# Patient Record
Sex: Male | Born: 1990 | Race: White | Hispanic: No | Marital: Married | Smoking: Never smoker
Health system: Southern US, Community
[De-identification: ages and names within clinical notes are randomized; demographics above are authoritative.]

## PROBLEM LIST (undated history)

## (undated) DIAGNOSIS — K219 Gastro-esophageal reflux disease without esophagitis: Secondary | ICD-10-CM

## (undated) DIAGNOSIS — F909 Attention-deficit hyperactivity disorder, unspecified type: Secondary | ICD-10-CM

## (undated) DIAGNOSIS — I10 Essential (primary) hypertension: Secondary | ICD-10-CM

## (undated) DIAGNOSIS — J302 Other seasonal allergic rhinitis: Secondary | ICD-10-CM

## (undated) HISTORY — PX: ADENOIDECTOMY: SUR15

## (undated) HISTORY — PX: TYMPANOPLASTY: SHX33

---
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
## 2010-09-04 NOTE — Op Note (Signed)
NAMETOBEN, ACUNA              ACCOUNT NO.:  [id number]   MEDICAL RECORD NO.:  [id number]          PATIENT TYPE:  AMB   LOCATION:  DSC                          FACILITY:  [facility]   PHYSICIAN:  Carolan Shiver, M.D.    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  Dustin Santiago has had a lifelong history of chronic  ear disease, he was first seen by me at age 70 months on February 07, 1991.  On February 20, 1991 he underwent bilateral myringotomy  Paparella type I  tubes.  He did well while the tubes were in place.  He required revision BMT  and a primary adenoidectomy on June 02, 1995.  By December 07, 1996, he  had traumatic perforation AD and a healing perforation AS.  He was continued  to be followed.  He developed some retropharyngeal lymphadenopathy in 2002,  which was worked up and was found to be negative for any abscess.  Eventually healed the right tympanic membrane but the left TM did not heal,  he was left with a dry central anterior 10% perforation and a very thin  atrophic tympanic membrane.  He was recommended for fat graft myringoplasty  AS.  He wanted to proceed after swimming season of 2007.  Preop audiometric  testing in July 15, 2005 documented normal hearing in both ears.   JUSTIFICATION:  A justification for this outpatient setting is age,  justification for overnight stay is not applicable.   ANESTHESIA:  General LMA anesthesia.   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   OPERATION:  Fat graft myringoplasty left tympanic membrane.   SURGEON:  Carolan Shiver, M.D.   ANESTHESIA:  General LMA   COMPLICATIONS:  None.   DISCHARGE STATUS:  Stable.   SUMMARY REPORT:  After the patient was taken to the operating room, he was  placed in supine position.  He had been preoperatively sedated.  General  mask induction was then performed, an IV was begun and he was orally  intubated with a LMA.  His hair was taped.  A stockinette cap was applied  around his left ear, his left external ear had been marked in the holding  area.  Time out was performed.  His left ear and posterior area were  cleansed with 70% isopropyl alcohol and his left medial earlobe was  infiltrated with 1 mL of 1% Xylocaine with 1:100,000 epinephrine.  His left  ear and hemiface were then prepped with Betadine and draped in the standard  fashion for myringoplasty.   Examination of the left ear canal revealed a 6 mm diameter canal.  The canal  was cleaned, there was a 10% dry central anterior perforation AS with a very  thin atrophic  tympanic membrane.  Middle ear mucosa was healthy as viewed  through the perforation.  The epithelial margin of the perforation was then  excised with a 90 degree Kraus TM MicroCut forceps.  The MicroCut was used  to remove the epithelial margin for 360 degrees.  There was a bleeding edge.  A 45 degree McCabe  perforation rasp was used to rasp for 360 degrees around  the mucosal surface of the perforation.  A 1 cm incision was then made to  the medial surface of the left earlobe, a fat graft was harvested and placed  in saline.  The incision was closed with running locking 5-0 Novofil and  bacitracin ointment was applied.   The fat graft was then transferred to the perforation and placed through the  perforation in a dumbbell fashion using a straight Dorma Russell pick.  An oversized  graft was utilized.  Three pledgets of Gelfoam soaked in Cipro HC were then  packed lateral to the graft to stabilize it.  The patient was then awakened,  extubated and transferred to his hospital bed.  He appeared  to tolerate the  general LMA anesthesia and the procedure well and left the operating room in  stable condition after a bacitracin cotton ball was placed in his ear canal.   Dustin Santiago will be discharged today as an outpatient with his mother.  She  will be instructed to return him to my office for followup on December 13, 2005, at 4:20 p.m.   DISCHARGE MEDICATIONS:  Discharge medications include the following:  1. Ceftin 500 mg p.o. twice daily  x10 days with food, #20.  2. Darvocet N100, #15, one to two p.o. every 4 hours p.r.n. pain.  3. Phenergan suppositories 25 mg, #2, one every 6 hours p.r.n. nausea.  4. Cipro HC 10 mL 3 drops AS three times daily x7 days.   DISCHARGE INSTRUCTIONS:  His mother is to have him follow a soft diet today,  regular diet tomorrow.  Keep his head elevated tonight.  Avoid aspirin or  aspirin products and avoid water exposure AS x6 weeks.  He may return to  other normal activities and a regular diet on December 08, 2005.           ______________________________  Carolan Shiver, M.D.     EMK/MEDQ  D:  12/07/2005  T:  12/07/2005  Job:  [id number]

## 2010-09-04 NOTE — Op Note (Signed)
[place]  Patient:    Dustin Santiago, Dustin Santiago Visit Number: [id number] MRN: [id number]          Service Type: DSU Location: [facility] Attending Physician:  Carlos Levering Dictated by:   Hyman Bible Pendse, M.D. Proc. Date: 04/17/01 Admit Date:  04/17/2001   CC:         Westley Hummer, M.D.   Operative Report  PREOPERATIVE DIAGNOSIS:  Multiple moles of left face 6 mm, left clavicle 6 mm, left scapular area 6 mm, and left groin, 2 cm and 1 cm.  POSTOPERATIVE DIAGNOSIS:  Multiple moles of left face 6 mm, left clavicle 6 mm, left scapular area 6 mm, and left groin, 2 cm and 1 cm.  OPERATION PERFORMED: 1. Excision of mole of left face, 6 mm and repair. 2. Excision of mole of left clavicular area, 6 mm and repair. 3. Excision of mole of left scapular area, 6 mm and repair. 4. Excisions of 2 cm x 1 cm pigmented mole of left groin and layered repair.  SURGEON:  Prabhakar D. Levie Heritage, M.D.  ASSISTANT:  Nurse.  ANESTHESIA:  Hewitt Shorts, M.D.  DESCRIPTION OF PROCEDURE:  Under satisfactory general endotracheal anesthesia, with the patient in supine position, all areas of the moles were thoroughly prepped and draped in the usual manner.  An elliptical incision was made around all the moles and full thickness skin was excised together with a 1 mm margin.  After excision of the moles, skin was undermined laterally and repair was carried out with 7-0 Prolene interrupted sutures for the left face, left scapular area and left clavicular area.  The left groin mole after excision the skin was undermined and repair was carried out in layers, deeper layer with 5-0 Vicryl interrupted sutures and skin with 6-0 nylon interrupted as well as running interlocking sutures.  Appropriate dressings applied. Throughout the procedure, the patients vital signs remained stable.  The patient withstood the procedure well and was transferred to the recovery  room in satisfactory general condition. Dictated by:   Hyman Bible Pendse, M.D. Attending Physician:  Carlos Levering DD:  04/17/01 TD:  04/17/01 Job: [id number]

## 2010-09-04 NOTE — Op Note (Signed)
[place]  Patient:    Dustin Santiago, Dustin Santiago Visit Number: [id number] MRN: [id number]          Service Type: DSU Location: [facility] Attending Physician:  Carlos Levering Dictated by:   Hyman Bible Pendse, M.D. Proc. Date: 04/17/01 Admit Date:  04/17/2001                             Operative Report  INCOMPLETE  ADDENDUM:  PREOPERATIVE DIAGNOSES: 1. Multiple pigmented moles. 2. Multiple (three) complete loose teeth.  POSTOPERATIVE DIAGNOSES: 1. Multiple pigmented moles. 2. Multiple (three) complete loose teeth.  PROCEDURE:  Excision of multiple moles Dictated by:   Hyman Bible. Pendse, M.D. Attending Physician:  Carlos Levering DD:  04/17/01 TD:  04/17/01 Job: [id number]

## 2010-09-04 NOTE — Op Note (Signed)
[place]  Patient:    Dustin Santiago, Dustin Santiago Visit Number: [id number] MRN: [id number]          Service Type: DSU Location: [facility] Attending Physician:  Dustin Santiago Dictated by:   Dustin Santiago, M.D. Proc. Date: 04/17/01 Admit Date:  04/17/2001                             Operative Report  ADDENDUM:  PREOPERATIVE DIAGNOSES: 1. Multiple pigmented moles. 2. Multiple loose teeth.  POSTOPERATIVE DIAGNOSES: 1. Multiple pigmented moles. 2. Multiple loose teeth.  PROCEDURES: 1. Excision of multiple pigmented moles and repair. 2. Excision of multiple loose teeth.  SURGEON: Prabhakar D. Levie Heritage, M.D.  ASSISTANT:  Nurse.  ANESTHESIA:  Nurse.  DESCRIPTION OF PROCEDURE:  Following the procedure of excision of multiple pigmented moles and appropriate repair, I was brought to the attention that patient had three loose teeth, two on the right side loose premolars, and one on the left side loose premolar.  All these were juvenile teeth elements, which were now grasped and by blunt dissection buccal tissue was separated from the root of the teeth and individually three teeth were excised.  The area was cleaned and appropriately dressed.  Throughout the procedure, the patients vital signs remained stable.  The patient withstood the procedure well and was transferred to recovery room in satisfactory general condition. Dictated by:   Dustin Santiago, M.D. Attending Physician:  Dustin Santiago DD:  04/17/01 TD:  04/17/01 Job: [id number]

## 2011-02-08 ENCOUNTER — Other Ambulatory Visit: Payer: Self-pay | Admitting: Occupational Medicine

## 2011-02-08 ENCOUNTER — Ambulatory Visit
Admission: RE | Admit: 2011-02-08 | Discharge: 2011-02-08 | Disposition: A | Discharge status: Home / Self Care | Payer: PRIVATE HEALTH INSURANCE | Source: Ambulatory Visit | Attending: Occupational Medicine | Admitting: Occupational Medicine

## 2011-02-08 DIAGNOSIS — Z021 Encounter for pre-employment examination: Secondary | ICD-10-CM

## 2011-06-04 ENCOUNTER — Encounter (HOSPITAL_COMMUNITY): Payer: Self-pay | Admitting: Emergency Medicine

## 2011-06-04 ENCOUNTER — Emergency Department (HOSPITAL_COMMUNITY)
Admission: EM | Admit: 2011-06-04 | Discharge: 2011-06-04 | Disposition: A | Discharge status: Home / Self Care | Payer: Self-pay | Source: Home / Self Care | Attending: Emergency Medicine | Admitting: Emergency Medicine

## 2011-06-04 DIAGNOSIS — H6690 Otitis media, unspecified, unspecified ear: Secondary | ICD-10-CM

## 2011-06-04 DIAGNOSIS — H6693 Otitis media, unspecified, bilateral: Secondary | ICD-10-CM

## 2011-06-04 DIAGNOSIS — J329 Chronic sinusitis, unspecified: Secondary | ICD-10-CM

## 2011-06-04 DIAGNOSIS — J069 Acute upper respiratory infection, unspecified: Secondary | ICD-10-CM

## 2011-06-04 MED ORDER — AMOXICILLIN-POT CLAVULANATE 875-125 MG PO TABS: 1.0000 | ORAL_TABLET | Freq: Two times a day (BID) | ORAL | Status: AC

## 2011-06-04 NOTE — ED Provider Notes (Signed)
Chief Complaint  Patient presents with  . Sinusitis  . URI    History of Present Illness:   Dustin Santiago has had a ten-day history of headache, sinus pressure, sore throat, nasal congestion with green rhinorrhea, watering of his left eye, has felt feverish and chilled, and has had a cough productive of green sputum. He denies any nausea or vomiting. He has had pain in both ears left worse than right.  Review of Systems:  Other than noted above, the patient denies any of the following symptoms. Systemic:  No fever, chills, sweats, fatigue, myalgias, headache, or anorexia. Eye:  No redness, pain or drainage. ENT:  No earache, nasal congestion, rhinorrhea, sinus pressure, or sore throat. Lungs:  No cough, sputum production, wheezing, shortness of breath. Or chest pain. GI:  No nausea, vomiting, abdominal pain or diarrhea. Skin:  No rash or itching.  PMFSH:  Past medical history, family history, social history, meds, and allergies were reviewed.  Physical Exam:   Vital signs:  BP 130/75  Pulse 90  Temp(Src) 97.9 F (36.6 C) (Oral)  Resp 18  SpO2 100% General:  Alert, in no distress. Eye:  No conjunctival injection or drainage. ENT:  Both TMs were erythematous and oral with fluid behind them.  Nasal mucosa was clear and uncongested, without drainage.  Mucous membranes were moist.  Pharynx was erythematous with cobblestoning.  There were no oral ulcerations or lesions. Neck:  Supple, no adenopathy, tenderness or mass. Lungs:  No respiratory distress.  Lungs were clear to auscultation, without wheezes, rales or rhonchi.  Breath sounds were clear and equal bilaterally. Heart:  Regular rhythm, without gallops, murmers or rubs. Skin:  Clear, warm, and dry, without rash or lesions.  Labs:  No results found for this or any previous visit.   Radiology:  No results found.  Assessment:   Diagnoses that have been ruled out:  None  Diagnoses that are still under consideration:  None  Final  diagnoses:  Upper respiratory infection  Sinusitis  Bilateral otitis media      Plan:   1.  The following meds were prescribed:   New Prescriptions   AMOXICILLIN-CLAVULANATE (AUGMENTIN) 875-125 MG PER TABLET    Take 1 tablet by mouth 2 (two) times daily.   2.  The patient was instructed in symptomatic care and handouts were given. 3.  The patient was told to return if becoming worse in any way, if no better in 3 or 4 days, and given some red flag symptoms that would indicate earlier return.   Roque Lias, MD 06/04/11 [phone]

## 2011-06-04 NOTE — ED Notes (Signed)
HERE WITH SINUS SX THAT STARTED 2 WEEK AGO WITH SINUS PRESSURE/POST NASAL DRAINAGE GREEN PHLEGM AND THROB H/A,LEFT WATERY EYE AND RUNNY NOSE WITH CHILLS AND FEVER.PT HAS BEEN TAKING IBUPROFEN

## 2012-03-15 ENCOUNTER — Emergency Department (HOSPITAL_COMMUNITY)
Admission: EM | Admit: 2012-03-15 | Discharge: 2012-03-15 | Disposition: A | Discharge status: Home / Self Care | Payer: Worker's Compensation | Attending: Emergency Medicine | Admitting: Emergency Medicine

## 2012-03-15 ENCOUNTER — Encounter (HOSPITAL_COMMUNITY): Payer: Self-pay | Admitting: Emergency Medicine

## 2012-03-15 DIAGNOSIS — S0003XA Contusion of scalp, initial encounter: Secondary | ICD-10-CM | POA: Insufficient documentation

## 2012-03-15 DIAGNOSIS — Y92410 Unspecified street and highway as the place of occurrence of the external cause: Secondary | ICD-10-CM | POA: Insufficient documentation

## 2012-03-15 DIAGNOSIS — Y939 Activity, unspecified: Secondary | ICD-10-CM | POA: Insufficient documentation

## 2012-03-15 DIAGNOSIS — S8000XA Contusion of unspecified knee, initial encounter: Secondary | ICD-10-CM | POA: Insufficient documentation

## 2012-03-15 DIAGNOSIS — S0083XA Contusion of other part of head, initial encounter: Secondary | ICD-10-CM | POA: Insufficient documentation

## 2012-03-15 DIAGNOSIS — S0093XA Contusion of unspecified part of head, initial encounter: Secondary | ICD-10-CM

## 2012-03-15 MED ORDER — IBUPROFEN 600 MG PO TABS: 600.0000 mg | ORAL_TABLET | Freq: Four times a day (QID) | ORAL | Status: DC | PRN
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
## 2012-03-15 NOTE — ED Provider Notes (Signed)
Medical screening examination/treatment/procedure(s) were performed by non-physician practitioner and as supervising physician I was immediately available for consultation/collaboration.  Juliet Rude. Rubin Payor, MD 03/15/12 [phone]
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
## 2013-11-14 ENCOUNTER — Emergency Department (HOSPITAL_COMMUNITY)
Admission: EM | Admit: 2013-11-14 | Discharge: 2013-11-15 | Disposition: A | Discharge status: Home / Self Care | Payer: Worker's Compensation | Attending: Emergency Medicine | Admitting: Emergency Medicine

## 2013-11-14 ENCOUNTER — Encounter (HOSPITAL_COMMUNITY): Payer: Self-pay | Admitting: Emergency Medicine

## 2013-11-14 DIAGNOSIS — Z7721 Contact with and (suspected) exposure to potentially hazardous body fluids: Secondary | ICD-10-CM | POA: Diagnosis not present
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
## 2013-11-15 NOTE — ED Notes (Signed)
Care handoff to [place], [place]

## 2013-11-15 NOTE — Discharge Instructions (Signed)
Followup with urgent medical and family care tomorrow for post exposure counseling and necessary lab followup.

## 2013-11-17 NOTE — ED Provider Notes (Signed)
Medical screening examination/treatment/procedure(s) were performed by non-physician practitioner and as supervising physician I was immediately available for consultation/collaboration.   EKG Interpretation None       Jacoby Zanni K Carsynn Bethune-Rasch, MD 11/17/13 2311

## 2014-07-03 ENCOUNTER — Encounter (HOSPITAL_BASED_OUTPATIENT_CLINIC_OR_DEPARTMENT_OTHER): Payer: Self-pay | Admitting: *Deleted

## 2014-07-10 ENCOUNTER — Ambulatory Visit (HOSPITAL_BASED_OUTPATIENT_CLINIC_OR_DEPARTMENT_OTHER)
Admission: RE | Admit: 2014-07-10 | Discharge: 2014-07-10 | Disposition: A | Discharge status: Home / Self Care | Payer: 59 | Source: Ambulatory Visit | Attending: Otolaryngology | Admitting: Otolaryngology

## 2014-07-10 ENCOUNTER — Encounter (HOSPITAL_BASED_OUTPATIENT_CLINIC_OR_DEPARTMENT_OTHER)
Admission: RE | Disposition: A | Discharge status: Home / Self Care | Payer: Self-pay | Source: Ambulatory Visit | Attending: Otolaryngology

## 2014-07-10 ENCOUNTER — Ambulatory Visit (HOSPITAL_BASED_OUTPATIENT_CLINIC_OR_DEPARTMENT_OTHER): Payer: 59 | Admitting: Anesthesiology

## 2014-07-10 ENCOUNTER — Encounter (HOSPITAL_BASED_OUTPATIENT_CLINIC_OR_DEPARTMENT_OTHER): Payer: Self-pay | Admitting: Anesthesiology

## 2014-07-10 DIAGNOSIS — J3501 Chronic tonsillitis: Secondary | ICD-10-CM | POA: Insufficient documentation

## 2014-07-10 DIAGNOSIS — Z7951 Long term (current) use of inhaled steroids: Secondary | ICD-10-CM | POA: Diagnosis not present

## 2014-07-10 DIAGNOSIS — K219 Gastro-esophageal reflux disease without esophagitis: Secondary | ICD-10-CM | POA: Diagnosis not present

## 2014-07-10 DIAGNOSIS — Z791 Long term (current) use of non-steroidal anti-inflammatories (NSAID): Secondary | ICD-10-CM | POA: Insufficient documentation

## 2014-07-10 DIAGNOSIS — A429 Actinomycosis, unspecified: Secondary | ICD-10-CM | POA: Insufficient documentation

## 2014-07-10 DIAGNOSIS — J302 Other seasonal allergic rhinitis: Secondary | ICD-10-CM | POA: Diagnosis not present

## 2014-07-10 DIAGNOSIS — J039 Acute tonsillitis, unspecified: Secondary | ICD-10-CM | POA: Diagnosis not present

## 2014-07-10 HISTORY — DX: Gastro-esophageal reflux disease without esophagitis: K21.9

## 2014-07-10 HISTORY — PX: TONSILLECTOMY: SHX5217

## 2014-07-10 HISTORY — DX: Other seasonal allergic rhinitis: J30.2

## 2014-07-10 LAB — POCT HEMOGLOBIN-HEMACUE: Hemoglobin: 17.7 g/dL — ABNORMAL HIGH (ref 13.0–17.0)

## 2014-07-10 SURGERY — TONSILLECTOMY
Anesthesia: General | Site: Throat | Laterality: Bilateral

## 2014-07-10 MED ORDER — OXYCODONE HCL 5 MG/5ML PO SOLN: 5.0000 mg | Freq: Once | ORAL | Status: DC | PRN

## 2014-07-10 MED ORDER — PHENOL 1.4 % MT LIQD: 1.0000 | OROMUCOSAL | Status: DC | PRN

## 2014-07-10 MED ORDER — OXYCODONE HCL 5 MG PO TABS: 5.0000 mg | ORAL_TABLET | Freq: Once | ORAL | Status: DC | PRN

## 2014-07-10 MED ORDER — DEXAMETHASONE SODIUM PHOSPHATE 10 MG/ML IJ SOLN
10.0000 mg | Freq: Once | INTRAMUSCULAR | Status: AC
  Administered 2014-07-10: 10 mg via INTRAVENOUS
  Filled 2014-07-10: qty 1

## 2014-07-10 MED ORDER — CEFAZOLIN SODIUM-DEXTROSE 2-3 GM-% IV SOLR
INTRAVENOUS | Status: DC | PRN
  Administered 2014-07-10: 2 g via INTRAVENOUS

## 2014-07-10 MED ORDER — BACITRACIN ZINC 500 UNIT/GM EX OINT
TOPICAL_OINTMENT | CUTANEOUS | Status: AC
  Filled 2014-07-10: qty 0.9

## 2014-07-10 MED ORDER — BACITRACIN 500 UNIT/GM EX OINT
TOPICAL_OINTMENT | CUTANEOUS | Status: DC | PRN
  Administered 2014-07-10: 1 via TOPICAL

## 2014-07-10 MED ORDER — MIDAZOLAM HCL 2 MG/2ML IJ SOLN: 1.0000 mg | INTRAMUSCULAR | Status: DC | PRN

## 2014-07-10 MED ORDER — MORPHINE SULFATE 2 MG/ML IJ SOLN: 2.0000 mg | INTRAMUSCULAR | Status: DC | PRN

## 2014-07-10 MED ORDER — ONDANSETRON HCL 4 MG PO TABS
4.0000 mg | ORAL_TABLET | ORAL | Status: DC | PRN
Start: 2014-07-10 — End: 2014-07-10

## 2014-07-10 MED ORDER — LACTATED RINGERS IV SOLN
INTRAVENOUS | Status: DC
  Administered 2014-07-10 (×2): via INTRAVENOUS

## 2014-07-10 MED ORDER — ONDANSETRON HCL 4 MG/2ML IJ SOLN: 4.0000 mg | Freq: Once | INTRAMUSCULAR | Status: DC | PRN

## 2014-07-10 MED ORDER — MIDAZOLAM HCL 5 MG/5ML IJ SOLN
INTRAMUSCULAR | Status: DC | PRN
  Administered 2014-07-10: 2 mg via INTRAVENOUS

## 2014-07-10 MED ORDER — HYDROMORPHONE HCL 1 MG/ML IJ SOLN
0.2500 mg | INTRAMUSCULAR | Status: DC | PRN
  Administered 2014-07-10: 0.5 mg via INTRAVENOUS
  Administered 2014-07-10: 0.25 mg via INTRAVENOUS

## 2014-07-10 MED ORDER — PROPOFOL 10 MG/ML IV BOLUS
INTRAVENOUS | Status: DC | PRN
  Administered 2014-07-10: 200 mg via INTRAVENOUS

## 2014-07-10 MED ORDER — MIDAZOLAM HCL 2 MG/2ML IJ SOLN
INTRAMUSCULAR | Status: AC
  Filled 2014-07-10: qty 2

## 2014-07-10 MED ORDER — DEXAMETHASONE SODIUM PHOSPHATE 10 MG/ML IJ SOLN: 10.0000 mg | Freq: Once | INTRAMUSCULAR | Status: DC

## 2014-07-10 MED ORDER — DEXAMETHASONE SODIUM PHOSPHATE 4 MG/ML IJ SOLN
INTRAMUSCULAR | Status: DC | PRN
  Administered 2014-07-10: 10 mg via INTRAVENOUS

## 2014-07-10 MED ORDER — HYDROCODONE-ACETAMINOPHEN 7.5-325 MG/15ML PO SOLN: 10.0000 mL | ORAL | Status: DC | PRN

## 2014-07-10 MED ORDER — SUCCINYLCHOLINE CHLORIDE 20 MG/ML IJ SOLN
INTRAMUSCULAR | Status: DC | PRN
  Administered 2014-07-10: 50 mg via INTRAVENOUS

## 2014-07-10 MED ORDER — ONDANSETRON HCL 4 MG/2ML IJ SOLN: 4.0000 mg | INTRAMUSCULAR | Status: DC | PRN

## 2014-07-10 MED ORDER — FENTANYL CITRATE 0.05 MG/ML IJ SOLN
INTRAMUSCULAR | Status: AC
  Filled 2014-07-10: qty 6

## 2014-07-10 MED ORDER — LIDOCAINE HCL (CARDIAC) 20 MG/ML IV SOLN
INTRAVENOUS | Status: DC | PRN
  Administered 2014-07-10: 50 mg via INTRAVENOUS

## 2014-07-10 MED ORDER — AMOXICILLIN-POT CLAVULANATE 250-62.5 MG/5ML PO SUSR: 10.0000 mL | Freq: Two times a day (BID) | ORAL | Status: DC

## 2014-07-10 MED ORDER — ONDANSETRON HCL 4 MG/2ML IJ SOLN
INTRAMUSCULAR | Status: DC | PRN
Start: 2014-07-10 — End: 2014-07-10
  Administered 2014-07-10: 4 mg via INTRAVENOUS

## 2014-07-10 MED ORDER — FENTANYL CITRATE 0.05 MG/ML IJ SOLN
INTRAMUSCULAR | Status: DC | PRN
  Administered 2014-07-10 (×3): 50 ug via INTRAVENOUS

## 2014-07-10 MED ORDER — CEFAZOLIN SODIUM-DEXTROSE 2-3 GM-% IV SOLR: 2000.0000 mg | Freq: Once | INTRAVENOUS | Status: DC

## 2014-07-10 MED ORDER — HYDROMORPHONE HCL 1 MG/ML IJ SOLN
INTRAMUSCULAR | Status: AC
  Filled 2014-07-10: qty 1

## 2014-07-10 MED ORDER — HYDROCODONE-ACETAMINOPHEN 7.5-325 MG/15ML PO SOLN
10.0000 mL | ORAL | Status: DC | PRN
  Administered 2014-07-10 (×2): 15 mL via ORAL
  Filled 2014-07-10 (×2): qty 15

## 2014-07-10 MED ORDER — DEXTROSE IN LACTATED RINGERS 5 % IV SOLN
INTRAVENOUS | Status: DC
  Administered 2014-07-10: 12:00:00 via INTRAVENOUS

## 2014-07-10 MED ORDER — FENTANYL CITRATE 0.05 MG/ML IJ SOLN: 50.0000 ug | INTRAMUSCULAR | Status: DC | PRN

## 2014-07-10 SURGICAL SUPPLY — 32 items
CANISTER SUCT 1200ML W/VALVE (MISCELLANEOUS) ×3 IMPLANT
CLEANER CAUTERY TIP 5X5 PAD (MISCELLANEOUS) IMPLANT
COAGULATOR SUCT 6 FR SWTCH (ELECTROSURGICAL) ×1
COAGULATOR SUCT SWTCH 10FR 6 (ELECTROSURGICAL) ×2 IMPLANT
COVER MAYO STAND STRL (DRAPES) ×3 IMPLANT
ELECT COATED BLADE 2.86 ST (ELECTRODE) ×3 IMPLANT
ELECT REM PT RETURN 9FT ADLT (ELECTROSURGICAL) ×3
ELECT REM PT RETURN 9FT PED (ELECTROSURGICAL)
ELECTRODE REM PT RETRN 9FT PED (ELECTROSURGICAL) IMPLANT
ELECTRODE REM PT RTRN 9FT ADLT (ELECTROSURGICAL) ×1 IMPLANT
GLOVE BIO SURGEON STRL SZ 6.5 (GLOVE) ×4 IMPLANT
GLOVE BIO SURGEONS STRL SZ 6.5 (GLOVE) ×2
GLOVE BIOGEL M 7.0 STRL (GLOVE) ×3 IMPLANT
GOWN STRL REUS W/ TWL LRG LVL3 (GOWN DISPOSABLE) ×2 IMPLANT
GOWN STRL REUS W/TWL LRG LVL3 (GOWN DISPOSABLE) ×4
MARKER SKIN DUAL TIP RULER LAB (MISCELLANEOUS) IMPLANT
NS IRRIG 1000ML POUR BTL (IV SOLUTION) ×3 IMPLANT
PAD CLEANER CAUTERY TIP 5X5 (MISCELLANEOUS)
PENCIL BUTTON HOLSTER BLD 10FT (ELECTRODE) ×3 IMPLANT
PIN SAFETY STERILE (MISCELLANEOUS) ×3 IMPLANT
SHEET MEDIUM DRAPE 40X70 STRL (DRAPES) ×3 IMPLANT
SOLUTION BUTLER CLEAR DIP (MISCELLANEOUS) ×3 IMPLANT
SPONGE GAUZE 4X4 12PLY STER LF (GAUZE/BANDAGES/DRESSINGS) ×3 IMPLANT
SPONGE TONSIL 1 RF SGL (DISPOSABLE) IMPLANT
SPONGE TONSIL 1.25 RF SGL STRG (GAUZE/BANDAGES/DRESSINGS) ×3 IMPLANT
SYR BULB 3OZ (MISCELLANEOUS) ×3 IMPLANT
TOWEL OR 17X24 6PK STRL BLUE (TOWEL DISPOSABLE) ×3 IMPLANT
TUBE CONNECTING 20'X1/4 (TUBING) ×1
TUBE CONNECTING 20X1/4 (TUBING) ×2 IMPLANT
TUBE SALEM SUMP 12R W/ARV (TUBING) IMPLANT
TUBE SALEM SUMP 16 FR W/ARV (TUBING) ×3 IMPLANT
YANKAUER SUCT BULB TIP NO VENT (SUCTIONS) ×3 IMPLANT

## 2014-07-10 NOTE — Anesthesia Postprocedure Evaluation (Signed)
  Anesthesia Post-op Note  Patient: Dustin Santiago  Procedure(s) Performed: Procedure(s): TONSILLECTOMY (Bilateral)  Patient Location: PACU  Anesthesia Type: General   Level of Consciousness: awake, alert  and oriented  Airway and Oxygen Therapy: Patient Spontanous Breathing  Post-op Pain: mild  Post-op Assessment: Post-op Vital signs reviewed  Post-op Vital Signs: Reviewed  Last Vitals:  Filed Vitals:   07/10/14 1200  BP: 143/84  Pulse: 74  Temp: 36.6 C  Resp: 16    Complications: No apparent anesthesia complications

## 2014-07-10 NOTE — Anesthesia Procedure Notes (Signed)
Procedure Name: Intubation Date/Time: 07/10/2014 9:46 AM Performed by: Marrianne Mood Pre-anesthesia Checklist: Patient identified, Emergency Drugs available, Suction available, Patient being monitored and Timeout performed Patient Re-evaluated:Patient Re-evaluated prior to inductionOxygen Delivery Method: Circle System Utilized Preoxygenation: Pre-oxygenation with 100% oxygen Intubation Type: IV induction Ventilation: Mask ventilation without difficulty Laryngoscope Size: Doo and 3 Grade View: Grade II Tube type: Oral Tube size: 8.0 mm Number of attempts: 1 Airway Equipment and Method: Stylet and Oral airway Placement Confirmation: ETT inserted through vocal cords under direct vision,  positive ETCO2 and breath sounds checked- equal and bilateral Tube secured with: Tape Dental Injury: Teeth and Oropharynx as per pre-operative assessment

## 2014-07-10 NOTE — Transfer of Care (Signed)
Immediate Anesthesia Transfer of Care Note  Patient: Dustin Santiago  Procedure(s) Performed: Procedure(s): TONSILLECTOMY (Bilateral)  Patient Location: PACU  Anesthesia Type:General  Level of Consciousness: awake and patient cooperative  Airway & Oxygen Therapy: Patient Spontanous Breathing and Patient connected to face mask oxygen  Post-op Assessment: Report given to RN and Post -op Vital signs reviewed and stable  Post vital signs: Reviewed and stable  Last Vitals:  Filed Vitals:   07/10/14 0834  BP: 136/83  Pulse: 62  Temp: 36.5 C  Resp: 16    Complications: No apparent anesthesia complications

## 2014-07-10 NOTE — Anesthesia Preprocedure Evaluation (Signed)
Anesthesia Evaluation  Patient identified by MRN, date of birth, ID band Patient awake    Reviewed: Allergy & Precautions, NPO status , Patient's Chart, lab work & pertinent test results  Airway Mallampati: I  TM Distance: >3 FB Neck ROM: Full    Dental  (+) Teeth Intact, Dental Advisory Given   Pulmonary  breath sounds clear to auscultation        Cardiovascular Rhythm:Regular Rate:Normal     Neuro/Psych    GI/Hepatic GERD-  Medicated and Controlled,  Endo/Other    Renal/GU      Musculoskeletal   Abdominal   Peds  Hematology   Anesthesia Other Findings   Reproductive/Obstetrics                             Anesthesia Physical Anesthesia Plan  ASA: II  Anesthesia Plan: General   Post-op Pain Management:    Induction: Intravenous  Airway Management Planned: Oral ETT  Additional Equipment:   Intra-op Plan:   Post-operative Plan: Extubation in OR  Informed Consent: I have reviewed the patients History and Physical, chart, labs and discussed the procedure including the risks, benefits and alternatives for the proposed anesthesia with the patient or authorized representative who has indicated his/her understanding and acceptance.   Dental advisory given  Plan Discussed with: CRNA, Anesthesiologist and Surgeon  Anesthesia Plan Comments:         Anesthesia Quick Evaluation

## 2014-07-10 NOTE — Discharge Instructions (Signed)

## 2014-07-10 NOTE — H&P (Signed)
Dustin Santiago is an 24 y.o. male.   Chief Complaint: tonsillitis HPI: recurrent ST and tonsillitis  Past Medical History  Diagnosis Date  . GERD (gastroesophageal reflux disease)   . Seasonal allergies     Past Surgical History  Procedure Laterality Date  . Adenoidectomy      History reviewed. No pertinent family history. Social History:  reports that he has never smoked. He does not have any smokeless tobacco history on file. He reports that he does not drink alcohol or use illicit drugs.  Allergies:  Allergies  Allergen Reactions  . Sulfa Antibiotics Anaphylaxis    Medications Prior to Admission  Medication Sig Dispense Refill  . fluticasone (FLONASE) 50 MCG/ACT nasal spray Place into both nostrils daily.    Marland Kitchen ibuprofen (ADVIL,MOTRIN) 600 MG tablet Take 1 tablet (600 mg total) by mouth every 6 (six) hours as needed for pain. 30 tablet 0    No results found for this or any previous visit (from the past 48 hour(s)). No results found.  Review of Systems  Constitutional: Negative.   HENT: Positive for sore throat.   Cardiovascular: Negative.   Gastrointestinal: Negative.     Height 5\' 10"  (1.778 m), weight 74.844 kg (165 lb). Physical Exam  Constitutional: He appears well-developed and well-nourished.  HENT:  Tonsil hypertrophy  Neck: Normal range of motion. Neck supple.  Cardiovascular: Normal rate.   Respiratory: Effort normal.     Assessment/Plan Adm for OP tonsillectomy.  Bolckow, Daril Warga 07/10/2014, 8:32 AM

## 2014-07-10 NOTE — Brief Op Note (Signed)
07/10/2014  10:08 AM  PATIENT:  Dustin Santiago  24 y.o. 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   Delay start of Pharmacological VTE agent (>24hrs) due to surgical blood loss or risk of bleeding: not applicable

## 2014-07-11 ENCOUNTER — Encounter (HOSPITAL_BASED_OUTPATIENT_CLINIC_OR_DEPARTMENT_OTHER): Payer: Self-pay | Admitting: Otolaryngology

## 2014-07-11 NOTE — Op Note (Signed)
Dustin Santiago, Dustin Santiago              ACCOUNT NO.:  [id number]  MEDICAL RECORD NO.:  [id number]  LOCATION:                                FACILITY:  [facility]  PHYSICIAN:  Early Chars. Wilburn Cornelia, M.D.DATE OF BIRTH:  1990-10-07  DATE OF PROCEDURE:  07/10/2014 DATE OF DISCHARGE:  07/10/2014                              OPERATIVE REPORT   LOCATION:  [place].  PREOPERATIVE DIAGNOSIS:  Chronic tonsillitis.  POSTOPERATIVE DIAGNOSIS:  Chronic tonsillitis.  INDICATION FOR SURGERY:  Chronic tonsillitis.  SURGICAL PROCEDURE:  Tonsillectomy.  ANESTHESIA:  General endotracheal.  COMPLICATIONS:  None.  BLOOD LOSS:  Minimal.  DISPOSITION:  The patient transferred from the operating room to the recovery room in stable condition.  BRIEF HISTORY:  The patient is a 24 year old male, who was referred to our office with a history of chronic low-grade sore throat, chronic tonsil discharge, and halitosis.  Examination showed 2+ cryptic tonsils with significant discharge.  Given the patient's history, examination, and findings, we discussed treatment options including observation versus tonsillectomy.  The risks and benefits of surgical procedure were discussed in detail, and the patient opted to undergo tonsillectomy under general anesthesia as an outpatient.  DESCRIPTION OF PROCEDURE:  The patient was brought to the operating room on July 10, 2014, placed in supine position on the operating table. General endotracheal anesthesia was established without difficulty. Crowe-Davis mouth gag was inserted without difficulty.  There was no loose or broken teeth.  The hard and soft palate were intact.  The procedure was begun on the left-hand side using Bovie electrocautery and dissecting in subcapsular fashion.  The entire left tonsil was removed from superior pole to tongue base.  Right tonsil was removed in a similar fashion and the tonsil tissue was sent to Pathology for  gross and microscopic evaluation.  The tonsillar fossa was gently abraded with a dry tonsil sponge and several areas of point hemorrhage were then cauterized with suction cautery.  The mouth gag was released and reapplied. There was no active bleeding.  Oropharynx was irrigated and an orogastric tube was passed.  The stomach contents were aspirated. The patient was then awakened from his anesthetic.  The mouth gag was released and removed.  Again, no bleeding, no loose or broken teeth.  He was extubated and transferred from the operating room to the recovery room in stable condition.  No complications.  Blood loss minimal.          ______________________________ Early Chars. Wilburn Cornelia, M.D.     DLS/MEDQ  D:  59/[id number]/5701  T:  07/10/2014  Job:  [id number]

## 2014-07-25 ENCOUNTER — Emergency Department (HOSPITAL_COMMUNITY)
Admission: EM | Admit: 2014-07-25 | Discharge: 2014-07-26 | Disposition: A | Discharge status: Home / Self Care | Payer: 59 | Attending: Emergency Medicine | Admitting: Emergency Medicine

## 2014-07-25 ENCOUNTER — Encounter (HOSPITAL_COMMUNITY): Payer: Self-pay | Admitting: Emergency Medicine

## 2014-07-25 DIAGNOSIS — Z7951 Long term (current) use of inhaled steroids: Secondary | ICD-10-CM | POA: Diagnosis not present

## 2014-07-25 DIAGNOSIS — R112 Nausea with vomiting, unspecified: Secondary | ICD-10-CM | POA: Insufficient documentation

## 2014-07-25 DIAGNOSIS — Z9089 Acquired absence of other organs: Secondary | ICD-10-CM | POA: Diagnosis not present

## 2014-07-25 DIAGNOSIS — R197 Diarrhea, unspecified: Secondary | ICD-10-CM | POA: Insufficient documentation

## 2014-07-25 DIAGNOSIS — Z8719 Personal history of other diseases of the digestive system: Secondary | ICD-10-CM | POA: Diagnosis not present

## 2014-07-25 DIAGNOSIS — R1110 Vomiting, unspecified: Secondary | ICD-10-CM

## 2014-07-25 LAB — CBC WITH DIFFERENTIAL/PLATELET
BASOS ABS: 0.1 10*3/uL (ref 0.0–0.1)
Basophils Relative: 1 % (ref 0–1)
EOS PCT: 1 % (ref 0–5)
Eosinophils Absolute: 0.1 10*3/uL (ref 0.0–0.7)
HCT: 45.8 % (ref 39.0–52.0)
Hemoglobin: 16 g/dL (ref 13.0–17.0)
LYMPHS ABS: 4.1 10*3/uL — AB (ref 0.7–4.0)
LYMPHS PCT: 38 % (ref 12–46)
MCH: 30.8 pg (ref 26.0–34.0)
MCHC: 34.9 g/dL (ref 30.0–36.0)
MCV: 88.1 fL (ref 78.0–100.0)
Monocytes Absolute: 0.8 10*3/uL (ref 0.1–1.0)
Monocytes Relative: 8 % (ref 3–12)
NEUTROS ABS: 5.8 10*3/uL (ref 1.7–7.7)
NEUTROS PCT: 52 % (ref 43–77)
PLATELETS: 287 10*3/uL (ref 150–400)
RBC: 5.2 MIL/uL (ref 4.22–5.81)
RDW: 11.6 % (ref 11.5–15.5)
WBC: 10.9 10*3/uL — ABNORMAL HIGH (ref 4.0–10.5)

## 2014-07-25 LAB — URINALYSIS, ROUTINE W REFLEX MICROSCOPIC
GLUCOSE, UA: NEGATIVE mg/dL
HGB URINE DIPSTICK: NEGATIVE
Ketones, ur: >80 mg/dL — AB
Leukocytes, UA: NEGATIVE
Nitrite: NEGATIVE
PH: 5.5 (ref 5.0–8.0)
Protein, ur: NEGATIVE mg/dL
SPECIFIC GRAVITY, URINE: 1.034 — AB (ref 1.005–1.030)
UROBILINOGEN UA: 0.2 mg/dL (ref 0.0–1.0)

## 2014-07-25 LAB — COMPREHENSIVE METABOLIC PANEL
ALK PHOS: 54 U/L (ref 39–117)
ALT: 63 U/L — ABNORMAL HIGH (ref 0–53)
ANION GAP: 13 (ref 5–15)
AST: 31 U/L (ref 0–37)
Albumin: 4.6 g/dL (ref 3.5–5.2)
BILIRUBIN TOTAL: 1 mg/dL (ref 0.3–1.2)
BUN: 13 mg/dL (ref 6–23)
CHLORIDE: 102 mmol/L (ref 96–112)
CO2: 26 mmol/L (ref 19–32)
Calcium: 9.7 mg/dL (ref 8.4–10.5)
Creatinine, Ser: 1.07 mg/dL (ref 0.50–1.35)
Glucose, Bld: 83 mg/dL (ref 70–99)
POTASSIUM: 3.7 mmol/L (ref 3.5–5.1)
Sodium: 141 mmol/L (ref 135–145)
Total Protein: 7.9 g/dL (ref 6.0–8.3)

## 2014-07-25 LAB — LIPASE, BLOOD: LIPASE: 19 U/L (ref 11–59)

## 2014-07-25 MED ORDER — SODIUM CHLORIDE 0.9 % IV BOLUS (SEPSIS)
1000.0000 mL | Freq: Once | INTRAVENOUS | Status: AC
  Administered 2014-07-25: 1000 mL via INTRAVENOUS

## 2014-07-25 MED ORDER — ONDANSETRON HCL 4 MG/2ML IJ SOLN
4.0000 mg | Freq: Once | INTRAMUSCULAR | Status: AC
  Administered 2014-07-25: 4 mg via INTRAVENOUS
  Filled 2014-07-25: qty 2

## 2014-07-25 MED ORDER — DEXAMETHASONE SODIUM PHOSPHATE 10 MG/ML IJ SOLN
10.0000 mg | Freq: Once | INTRAMUSCULAR | Status: AC
  Administered 2014-07-25: 10 mg via INTRAVENOUS
  Filled 2014-07-25: qty 1

## 2014-07-25 NOTE — ED Notes (Signed)
Pt states that he had his tonsils removed on 3/23.  States that he began having NVD on Sunday.  States that he stopped vomiting on Tuesday but has continued to have "constant diarrhea".  C/o subjective fever.  States that he has had 12 saltine crackers and 2 cups of gatorade since Monday.

## 2014-07-26 ENCOUNTER — Encounter (HOSPITAL_COMMUNITY): Payer: Self-pay | Admitting: Emergency Medicine

## 2014-07-26 DIAGNOSIS — R112 Nausea with vomiting, unspecified: Secondary | ICD-10-CM | POA: Diagnosis not present

## 2014-07-26 MED ORDER — ONDANSETRON 8 MG PO TBDP: ORAL_TABLET | ORAL | Status: DC

## 2014-07-26 MED ORDER — OMEPRAZOLE 20 MG PO CPDR: 20.0000 mg | DELAYED_RELEASE_CAPSULE | Freq: Every day | ORAL | Status: DC

## 2014-07-26 NOTE — ED Notes (Addendum)
Pt given ginger ale for PO challenge per Dr Lars Masson

## 2014-07-26 NOTE — ED Provider Notes (Signed)
CSN: [id number]     Arrival date & time 07/25/14  1902 History   First MD Initiated Contact with Patient 07/25/14 2308     Chief Complaint  Patient presents with  . Post-op Problem  . Nausea  . Emesis  . Diarrhea     (Consider location/radiation/quality/duration/timing/severity/associated sxs/prior Treatment) Patient is a 24 y.o. male presenting with vomiting and diarrhea. The history is provided by the patient.  Emesis Severity:  Moderate Timing:  Intermittent Quality:  Stomach contents Progression:  Unchanged Chronicity:  New Recent urination:  Normal Relieved by:  Nothing Worsened by:  Nothing tried Ineffective treatments:  None tried Associated symptoms: diarrhea   Associated symptoms: no abdominal pain   Risk factors: no alcohol use   Diarrhea Quality:  Watery Severity:  Moderate Onset quality:  Sudden Timing:  Intermittent Progression:  Unchanged Relieved by:  Nothing Worsened by:  Nothing tried Ineffective treatments:  None tried Associated symptoms: vomiting   Associated symptoms: no abdominal pain and no fever   Patient was taking 16 mg of vicodin every few hours for 2 weeks post op from tonsil removal and stopped on Sunday then started with n/v/d.    Past Medical History  Diagnosis Date  . GERD (gastroesophageal reflux disease)   . Seasonal allergies    Past Surgical History  Procedure Laterality Date  . Adenoidectomy    . Tonsillectomy Bilateral 07/10/2014    Procedure: TONSILLECTOMY;  Surgeon: Jerrell Belfast, MD;  Location: [facility];  Service: ENT;  Laterality: Bilateral;   History reviewed. No pertinent family history. History  Substance Use Topics  . Smoking status: Never Smoker   . Smokeless tobacco: Not on file  . Alcohol Use: No    Review of Systems  Constitutional: Negative for fever.  Gastrointestinal: Positive for vomiting and diarrhea. Negative for abdominal pain.  All other systems reviewed and are  negative.     Allergies  Sulfa antibiotics  Home Medications   Prior to Admission medications   Medication Sig Start Date End Date Taking? Authorizing Provider  acetaminophen (TYLENOL) 500 MG tablet Take 1,000 mg by mouth every 6 (six) hours as needed for moderate pain (pain).   Yes Historical Provider, MD  fluticasone (FLONASE) 50 MCG/ACT nasal spray Place into both nostrils daily.   Yes Historical Provider, MD  amoxicillin-clavulanate (AUGMENTIN) 250-62.5 MG/5ML suspension Take 10 mLs by mouth 2 (two) times daily. Patient not taking: Reported on 07/25/2014 07/10/14   Jerrell Belfast, MD  HYDROcodone-acetaminophen (HYCET) 7.5-325 mg/15 ml solution Take 10-15 mLs by mouth every 4 (four) hours as needed for moderate pain. Patient not taking: Reported on 07/25/2014 07/10/14   Jerrell Belfast, MD   BP 131/78 mmHg  Pulse 50  Temp(Src) 98.5 F (36.9 C) (Oral)  Resp 18  SpO2 100% Physical Exam  Constitutional: He is oriented to person, place, and time. He appears well-developed and well-nourished. No distress.  HENT:  Head: Normocephalic and atraumatic.  Mouth/Throat: Oropharynx is clear and moist.  Eyes: Conjunctivae are normal. Pupils are equal, round, and reactive to light.  Neck: Normal range of motion. Neck supple.  Cardiovascular: Normal rate, regular rhythm and intact distal pulses.   Pulmonary/Chest: Effort normal and breath sounds normal. No respiratory distress. He has no wheezes. He has no rales.  Abdominal: Soft. Bowel sounds are normal. There is no tenderness. There is no rebound and no guarding.  Musculoskeletal: Normal range of motion.  Neurological: He is alert and oriented to person, place, and time.  Skin: Skin is warm and dry.  Psychiatric: He has a normal mood and affect.    ED Course  Procedures (including critical care time) Labs Review Labs Reviewed  CBC WITH DIFFERENTIAL/PLATELET - Abnormal; Notable for the following:    WBC 10.9 (*)    Lymphs Abs 4.1 (*)     All other components within normal limits  COMPREHENSIVE METABOLIC PANEL - Abnormal; Notable for the following:    ALT 63 (*)    All other components within normal limits  URINALYSIS, ROUTINE W REFLEX MICROSCOPIC - Abnormal; Notable for the following:    Color, Urine AMBER (*)    APPearance CLOUDY (*)    Specific Gravity, Urine 1.034 (*)    Bilirubin Urine SMALL (*)    Ketones, ur >80 (*)    All other components within normal limits  LIPASE, BLOOD    Imaging Review No results found.   EKG Interpretation None      MDM   Final diagnoses:  None   Symptoms consistent with withdrawal from narcotics.  IVF and antiemetics given.  PO challenged successfully.  Will send home with zofran PPI and slow advance of diet.       Veatrice Kells, MD 07/26/14 0120

## 2015-07-16 DIAGNOSIS — J019 Acute sinusitis, unspecified: Secondary | ICD-10-CM | POA: Diagnosis not present

## 2016-06-21 DIAGNOSIS — Z0000 Encounter for general adult medical examination without abnormal findings: Secondary | ICD-10-CM | POA: Diagnosis not present

## 2017-01-20 DIAGNOSIS — B078 Other viral warts: Secondary | ICD-10-CM | POA: Diagnosis not present

## 2017-01-20 DIAGNOSIS — L858 Other specified epidermal thickening: Secondary | ICD-10-CM | POA: Diagnosis not present

## 2017-01-20 DIAGNOSIS — D225 Melanocytic nevi of trunk: Secondary | ICD-10-CM | POA: Diagnosis not present

## 2017-01-20 DIAGNOSIS — L700 Acne vulgaris: Secondary | ICD-10-CM | POA: Diagnosis not present

## 2017-06-24 DIAGNOSIS — Z0000 Encounter for general adult medical examination without abnormal findings: Secondary | ICD-10-CM | POA: Diagnosis not present

## 2017-11-30 DIAGNOSIS — H1013 Acute atopic conjunctivitis, bilateral: Secondary | ICD-10-CM | POA: Diagnosis not present

## 2018-01-17 DIAGNOSIS — B070 Plantar wart: Secondary | ICD-10-CM | POA: Diagnosis not present

## 2018-01-17 DIAGNOSIS — B078 Other viral warts: Secondary | ICD-10-CM | POA: Diagnosis not present

## 2018-01-17 DIAGNOSIS — L731 Pseudofolliculitis barbae: Secondary | ICD-10-CM | POA: Diagnosis not present

## 2018-04-13 DIAGNOSIS — J0140 Acute pansinusitis, unspecified: Secondary | ICD-10-CM | POA: Diagnosis not present

## 2022-11-27 ENCOUNTER — Ambulatory Visit
Admission: EM | Admit: 2022-11-27 | Discharge: 2022-11-27 | Disposition: A | Discharge status: Home / Self Care | Payer: 59 | Source: Home / Self Care

## 2022-11-27 DIAGNOSIS — H7291 Unspecified perforation of tympanic membrane, right ear: Secondary | ICD-10-CM | POA: Diagnosis not present

## 2022-11-27 HISTORY — DX: Essential (primary) hypertension: I10

## 2022-11-27 MED ORDER — AMOXICILLIN 875 MG PO TABS: 875.0000 mg | ORAL_TABLET | Freq: Two times a day (BID) | ORAL | 0 refills | Status: DC

## 2022-11-27 MED ORDER — CIPROFLOXACIN-DEXAMETHASONE 0.3-0.1 % OT SUSP: 4.0000 [drp] | Freq: Two times a day (BID) | OTIC | 0 refills | Status: DC

## 2022-11-27 MED ORDER — CIPROFLOXACIN HCL 0.2 % OT SOLN: 0.2000 mL | Freq: Two times a day (BID) | OTIC | 0 refills | Status: DC
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
## 2023-03-28 ENCOUNTER — Other Ambulatory Visit: Payer: Self-pay

## 2023-03-28 ENCOUNTER — Encounter (HOSPITAL_COMMUNITY): Payer: Self-pay | Admitting: Otolaryngology

## 2023-03-28 NOTE — Progress Notes (Signed)
PCP - Jackelyn Poling Cardiologist -   PPM/ICD - denies Device Orders - n/a Rep Notified - n/a  Chest x-ray - denies EKG DOS Stress Test - denies ECHO - denies Cardiac Cath - denies  CPAP - denies   Denies  Blood Thinner Instructions: denies Aspirin Instructions: n/a  ERAS Protcol - clear liquids until 6:45 am  COVID TEST- n/a  Anesthesia review: no  Patient verbally denies any shortness of breath, fever, cough and chest pain during phone call   -------------  SDW INSTRUCTIONS given:  Your procedure is scheduled on March 30, 2023.  Report to [facility] Main Entrance "A" at 7:15 A.M., and check in at the Admitting office.  Call this number if you have problems the morning of surgery:  [phone]   Remember:  Do not eat after midnight the night before your surgery  You may drink clear liquids until 6:45 the morning of your surgery.   Clear liquids allowed are: Water, Non-Citrus Juices (without pulp), Carbonated Beverages, Clear Tea, Black Coffee Only, and Gatorade    Take these medicines the morning of surgery with A SIP OF WATER  fluticasone (FLONASE)  IF NEEDED  As of today, STOP taking any Aspirin (unless otherwise instructed by your surgeon) Aleve, Naproxen, Ibuprofen, Motrin, Advil, Goody's, BC's, all herbal medications, fish oil, and all vitamins.                      Do not wear jewelry, make up, or nail polish            Do not wear lotions, powders, perfumes/colognes, or deodorant.            Do not shave 48 hours prior to surgery.  Men may shave face and neck.            Do not bring valuables to the hospital.            [facility] is not responsible for any belongings or valuables.  Do NOT Smoke (Tobacco/Vaping) 24 hours prior to your procedure If you use a CPAP at night, you may bring all equipment for your overnight stay.   Contacts, glasses, dentures or bridgework may not be worn into surgery.      For patients admitted to the hospital,  discharge time will be determined by your treatment team.   Patients discharged the day of surgery will not be allowed to drive home, and someone needs to stay with them for 24 hours.    Special instructions:   - Preparing For Surgery  Before surgery, you can play an important role. Because skin is not sterile, your skin needs to be as free of germs as possible. You can reduce the number of germs on your skin by washing with CHG (chlorahexidine gluconate) Soap before surgery.  CHG is an antiseptic cleaner which kills germs and bonds with the skin to continue killing germs even after washing.    Oral Hygiene is also important to reduce your risk of infection.  Remember - BRUSH YOUR TEETH THE MORNING OF SURGERY WITH YOUR REGULAR TOOTHPASTE  Please do not use if you have an allergy to CHG or antibacterial soaps. If your skin becomes reddened/irritated stop using the CHG.  Do not shave (including legs and underarms) for at least 48 hours prior to first CHG shower. It is OK to shave your face.  Please follow these instructions carefully.   Shower the NIGHT BEFORE SURGERY and the [facility]  OF SURGERY with DIAL Soap.   Pat yourself dry with a CLEAN TOWEL.  Wear CLEAN PAJAMAS to bed the night before surgery  Place CLEAN SHEETS on your bed the night of your first shower and DO NOT SLEEP WITH PETS.   Day of Surgery: Please shower morning of surgery  Wear Clean/Comfortable clothing the morning of surgery Do not apply any deodorants/lotions.   Remember to brush your teeth WITH YOUR REGULAR TOOTHPASTE.   Questions were answered. Patient verbalized understanding of instructions.

## 2023-03-29 NOTE — H&P (Signed)
HPI:   Chief Complaint  Patient presents with  Otitis Media  Patient states he has a right ruptured ear drum, muffled hearing.   Dustin Santiago is a 32 y.o. male who presents as a new patient for right ear concerns. Patient seen at [facility] on 11/27/22 for bilateral sudden right sided ear pain followed by decreased hearing. He reports a few days before having a fever with cough; symptoms improved with motrin. The ear pain was described as an ice pick; there was no ear drainage only decreased hearing. No tinnitus or dizziness. There was no external trauma to the ear. Patient underwent audiometric evaluation with [facility] a couple of weeks later demonstrating a right sided loss; he does not have a copy of the results with him. He states the ear pain is resolved but the hearing is still muffled on the right.   Surgeries include multiple sets of tubes as a young child and left-sided tympanoplasty as a teenager (subsequent to water trauma).   No PMH of heart attack, stroke, asthma, obstructive sleep apnea, bleeding disorder or adverse reaction to anesthesia.   Non-smoker.   PMH/Meds/All/SocHx/FamHx/ROS:   History reviewed. No pertinent past medical history.  History reviewed. No pertinent surgical history.  No family history of bleeding disorders, wound healing problems or difficulty with anesthesia.     Current Outpatient Medications:  acetaminophen (TYLENOL) 500 mg tablet, Take 1,000 mg by mouth., Disp: , Rfl:  azelastine (ASTELIN) 137 mcg (0.1 %) nasal spray, 1 puff in each nostril as needed Nasally Twice a day, Disp: , Rfl:  fluticasone propionate (FLONASE) 50 mcg/spray nasal spray, USE 2 SPRAY IN EACH NOSTRIL AT BEDTIME, Disp: 16 g, Rfl: 11 levocetirizine (XYZAL) 5 mg tablet, Take 1 tablet by mouth every evening., Disp: , Rfl:  multivitamin (FLINTSTONE'S) chewable tablet, Chew., Disp: , Rfl:  omega-3 fatty acids-fish oil (FISH OIL) 1,000 mg cap capsule, Take 2 g by mouth., Disp: ,  Rfl:  valsartan (DIOVAN) 80 mg tablet, Take 80 mg by mouth daily., Disp: , Rfl:   A complete ROS was performed with pertinent positives/negatives noted in the HPI. The remainder of the ROS are negative.   Physical Exam:   Temp 97.6 F (36.4 C) (Temporal)  Resp 16  Ht 1.778 m (5\' 10" )  Wt 82 kg (180 lb 12.8 oz)  BMI 25.94 kg/m   General Awake, at baseline alertness during examination.  Eyes No scleral icterus or conjunctival hemorrhage. Globe position appears normal. EOMI.  Right Ear EAC patent, TM with clean, dry 20% perforation, superior-anterior quadrant. Middle ear well aerated. No signs of infection or cholesteatoma.  Weber is symmetric x 2. AC>BC for both ears (512 Hz tuning fork).  Left Ear EAC patent, TM intact w/o inflammation. Fat graft noted in superior-anterior quadrant. Middle ear well aerated.  Nose Patent, no polyps or masses seen on anterior rhinoscopy.  Oral cavity No mucosal lesions or tumors seen. Tongue midline.  Oropharynx Tonsils absent.  Neck No abnormal cervical lymphadenopathy. No thyromegaly. No thyroid masses palpated.  Cardio-vascular No cyanosis.  Pulmonary No audible stridor. Breathing easily with no labor.  Neuro Symmetric facial movement.  Psychiatry Appropriate affect and mood for clinic visit.   Independent Review of Additional Tests or Records:  Medical records.   Procedures:  None  Impression & Plans:  Dustin Santiago is a 32 y.o. 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 Today's exam demonstrates a clean and  dry 20% TM perforation on the right. We will request a copy of patient's recent outside audiogram with [facility]; medical release form signed. Recommend strict water precautions for this ear. Recommend follow up in one month with Dr. Pollyann Kennedy for recheck and to discuss possible tympanoplasty.   Patient agrees with the plan.

## 2023-03-30 ENCOUNTER — Other Ambulatory Visit: Payer: Self-pay

## 2023-03-30 ENCOUNTER — Ambulatory Visit (HOSPITAL_COMMUNITY)
Admission: RE | Admit: 2023-03-30 | Discharge: 2023-03-30 | Disposition: A | Discharge status: Home / Self Care | Payer: 59 | Attending: Otolaryngology | Admitting: Otolaryngology

## 2023-03-30 ENCOUNTER — Encounter (HOSPITAL_COMMUNITY)
Admission: RE | Disposition: A | Discharge status: Home / Self Care | Payer: Self-pay | Source: Home / Self Care | Attending: Otolaryngology

## 2023-03-30 ENCOUNTER — Encounter (HOSPITAL_COMMUNITY): Payer: Self-pay | Admitting: Otolaryngology

## 2023-03-30 ENCOUNTER — Ambulatory Visit (HOSPITAL_COMMUNITY): Payer: 59 | Admitting: Anesthesiology

## 2023-03-30 ENCOUNTER — Ambulatory Visit (HOSPITAL_BASED_OUTPATIENT_CLINIC_OR_DEPARTMENT_OTHER): Payer: 59 | Admitting: Anesthesiology

## 2023-03-30 DIAGNOSIS — I498 Other specified cardiac arrhythmias: Secondary | ICD-10-CM | POA: Diagnosis not present

## 2023-03-30 DIAGNOSIS — I10 Essential (primary) hypertension: Secondary | ICD-10-CM | POA: Diagnosis not present

## 2023-03-30 DIAGNOSIS — H6691 Otitis media, unspecified, right ear: Secondary | ICD-10-CM | POA: Diagnosis not present

## 2023-03-30 DIAGNOSIS — H7291 Unspecified perforation of tympanic membrane, right ear: Secondary | ICD-10-CM | POA: Diagnosis not present

## 2023-03-30 DIAGNOSIS — Z79899 Other long term (current) drug therapy: Secondary | ICD-10-CM | POA: Insufficient documentation

## 2023-03-30 DIAGNOSIS — K219 Gastro-esophageal reflux disease without esophagitis: Secondary | ICD-10-CM | POA: Insufficient documentation

## 2023-03-30 DIAGNOSIS — H9011 Conductive hearing loss, unilateral, right ear, with unrestricted hearing on the contralateral side: Secondary | ICD-10-CM | POA: Insufficient documentation

## 2023-03-30 HISTORY — DX: Attention-deficit hyperactivity disorder, unspecified type: F90.9

## 2023-03-30 HISTORY — PX: TYMPANOPLASTY: SHX33

## 2023-03-30 LAB — BASIC METABOLIC PANEL
Anion gap: 6 (ref 5–15)
BUN: 13 mg/dL (ref 6–20)
CO2: 27 mmol/L (ref 22–32)
Calcium: 9.5 mg/dL (ref 8.9–10.3)
Chloride: 106 mmol/L (ref 98–111)
Creatinine, Ser: 1.29 mg/dL — ABNORMAL HIGH (ref 0.61–1.24)
GFR, Estimated: >60 mL/min (ref >60)
Glucose, Bld: 92 mg/dL (ref 70–99)
Potassium: 3.7 mmol/L (ref 3.5–5.1)
Sodium: 139 mmol/L (ref 135–145)

## 2023-03-30 LAB — CBC
HCT: 46.2 % (ref 39.0–52.0)
Hemoglobin: 15.6 g/dL (ref 13.0–17.0)
MCH: 31 pg (ref 26.0–34.0)
MCHC: 33.8 g/dL (ref 30.0–36.0)
MCV: 91.7 fL (ref 80.0–100.0)
Platelets: 232 10*3/uL (ref 150–400)
RBC: 5.04 MIL/uL (ref 4.22–5.81)
RDW: 11.7 % (ref 11.5–15.5)
WBC: 5.2 10*3/uL (ref 4.0–10.5)
nRBC: 0 % (ref 0.0–0.2)

## 2023-03-30 SURGERY — TYMPANOPLASTY
Anesthesia: General | Site: Ear | Laterality: Right

## 2023-03-30 MED ORDER — FENTANYL CITRATE (PF) 250 MCG/5ML IJ SOLN
INTRAMUSCULAR | Status: AC
  Filled 2023-03-30: qty 5

## 2023-03-30 MED ORDER — BACITRACIN ZINC 500 UNIT/GM EX OINT
TOPICAL_OINTMENT | CUTANEOUS | Status: DC | PRN
  Administered 2023-03-30: 1 via TOPICAL

## 2023-03-30 MED ORDER — HEMOSTATIC AGENTS (NO CHARGE) OPTIME
TOPICAL | Status: DC | PRN
  Administered 2023-03-30: 1 via TOPICAL

## 2023-03-30 MED ORDER — HYDROCODONE-ACETAMINOPHEN 7.5-325 MG PO TABS: 1.0000 | ORAL_TABLET | Freq: Four times a day (QID) | ORAL | 0 refills | Status: AC | PRN

## 2023-03-30 MED ORDER — PHENYLEPHRINE 80 MCG/ML (10ML) SYRINGE FOR IV PUSH (FOR BLOOD PRESSURE SUPPORT)
PREFILLED_SYRINGE | INTRAVENOUS | Status: DC | PRN
  Administered 2023-03-30 (×2): 80 ug via INTRAVENOUS

## 2023-03-30 MED ORDER — MIDAZOLAM HCL 2 MG/2ML IJ SOLN
INTRAMUSCULAR | Status: DC | PRN
  Administered 2023-03-30: 2 mg via INTRAVENOUS

## 2023-03-30 MED ORDER — SUGAMMADEX SODIUM 200 MG/2ML IV SOLN
INTRAVENOUS | Status: DC | PRN
  Administered 2023-03-30: 163.2 mg via INTRAVENOUS

## 2023-03-30 MED ORDER — MIDAZOLAM HCL 2 MG/2ML IJ SOLN
INTRAMUSCULAR | Status: AC
  Filled 2023-03-30: qty 2

## 2023-03-30 MED ORDER — AMISULPRIDE (ANTIEMETIC) 5 MG/2ML IV SOLN
INTRAVENOUS | Status: AC
  Filled 2023-03-30: qty 4

## 2023-03-30 MED ORDER — CIPROFLOXACIN-DEXAMETHASONE 0.3-0.1 % OT SUSP
OTIC | Status: AC
  Filled 2023-03-30: qty 7.5

## 2023-03-30 MED ORDER — LABETALOL HCL 5 MG/ML IV SOLN
INTRAVENOUS | Status: AC
  Filled 2023-03-30: qty 4

## 2023-03-30 MED ORDER — LACTATED RINGERS IV SOLN: INTRAVENOUS | Status: DC

## 2023-03-30 MED ORDER — ONDANSETRON 4 MG PO TBDP: 4.0000 mg | ORAL_TABLET | Freq: Three times a day (TID) | ORAL | 0 refills | Status: AC | PRN

## 2023-03-30 MED ORDER — DEXAMETHASONE SODIUM PHOSPHATE 10 MG/ML IJ SOLN
INTRAMUSCULAR | Status: DC | PRN
  Administered 2023-03-30: 10 mg via INTRAVENOUS

## 2023-03-30 MED ORDER — BACITRACIN ZINC 500 UNIT/GM EX OINT
TOPICAL_OINTMENT | CUTANEOUS | Status: AC
  Filled 2023-03-30: qty 28.35

## 2023-03-30 MED ORDER — FENTANYL CITRATE (PF) 250 MCG/5ML IJ SOLN
INTRAMUSCULAR | Status: DC | PRN
  Administered 2023-03-30: 100 ug via INTRAVENOUS
  Administered 2023-03-30: 50 ug via INTRAVENOUS

## 2023-03-30 MED ORDER — ORAL CARE MOUTH RINSE: 15.0000 mL | Freq: Once | OROMUCOSAL | Status: AC

## 2023-03-30 MED ORDER — AMISULPRIDE (ANTIEMETIC) 5 MG/2ML IV SOLN
10.0000 mg | Freq: Once | INTRAVENOUS | Status: AC
Start: 2023-03-30 — End: 2023-03-30
  Administered 2023-03-30: 10 mg via INTRAVENOUS

## 2023-03-30 MED ORDER — ONDANSETRON HCL 4 MG/2ML IJ SOLN
INTRAMUSCULAR | Status: DC | PRN
  Administered 2023-03-30: 4 mg via INTRAVENOUS

## 2023-03-30 MED ORDER — LIDOCAINE 2% (20 MG/ML) 5 ML SYRINGE
INTRAMUSCULAR | Status: DC | PRN
  Administered 2023-03-30: 60 mg via INTRAVENOUS

## 2023-03-30 MED ORDER — CIPROFLOXACIN-DEXAMETHASONE 0.3-0.1 % OT SUSP: 3.0000 [drp] | Freq: Two times a day (BID) | OTIC | 2 refills | Status: AC

## 2023-03-30 MED ORDER — PROPOFOL 10 MG/ML IV BOLUS
INTRAVENOUS | Status: DC | PRN
  Administered 2023-03-30: 150 mg via INTRAVENOUS

## 2023-03-30 MED ORDER — ROCURONIUM BROMIDE 10 MG/ML (PF) SYRINGE
PREFILLED_SYRINGE | INTRAVENOUS | Status: DC | PRN
  Administered 2023-03-30: 50 mg via INTRAVENOUS

## 2023-03-30 MED ORDER — CHLORHEXIDINE GLUCONATE 0.12 % MT SOLN
15.0000 mL | Freq: Once | OROMUCOSAL | Status: AC
  Administered 2023-03-30: 15 mL via OROMUCOSAL
  Filled 2023-03-30: qty 15

## 2023-03-30 MED ORDER — LIDOCAINE-EPINEPHRINE 1 %-1:100000 IJ SOLN
INTRAMUSCULAR | Status: DC | PRN
  Administered 2023-03-30: 4 mL

## 2023-03-30 MED ORDER — HYDROMORPHONE HCL 1 MG/ML IJ SOLN
0.2500 mg | INTRAMUSCULAR | Status: DC | PRN
Start: 2023-03-30 — End: 2023-03-30

## 2023-03-30 MED ORDER — LIDOCAINE-EPINEPHRINE 1 %-1:100000 IJ SOLN
INTRAMUSCULAR | Status: AC
  Filled 2023-03-30: qty 1

## 2023-03-30 MED ORDER — SODIUM CHLORIDE 0.9 % IV SOLN: INTRAVENOUS | Status: DC

## 2023-03-30 MED ORDER — PROPOFOL 10 MG/ML IV BOLUS
INTRAVENOUS | Status: AC
  Filled 2023-03-30: qty 20

## 2023-03-30 MED ORDER — CIPROFLOXACIN-DEXAMETHASONE 0.3-0.1 % OT SUSP
OTIC | Status: DC | PRN
  Administered 2023-03-30: 12 [drp] via OTIC

## 2023-03-30 SURGICAL SUPPLY — 49 items
ATTRACTOMAT 16X20 MAGNETIC DRP (DRAPES) IMPLANT
BAG COUNTER SPONGE SURGICOUNT (BAG) ×1 IMPLANT
BENZOIN TINCTURE PRP APPL 2/3 (GAUZE/BANDAGES/DRESSINGS) IMPLANT
BLADE SURG 15 STRL LF DISP TIS (BLADE) IMPLANT
BNDG STRETCH 4X75 STRL LF (GAUZE/BANDAGES/DRESSINGS) IMPLANT
BNDG STRETCH GAUZE 3IN X12FT (GAUZE/BANDAGES/DRESSINGS) IMPLANT
CANISTER SUCT 3000ML PPV (MISCELLANEOUS) ×1 IMPLANT
CLEANER TIP ELECTROSURG 2X2 (MISCELLANEOUS) ×1 IMPLANT
COTTONBALL LRG STERILE PKG (GAUZE/BANDAGES/DRESSINGS) IMPLANT
COVER SURGICAL LIGHT HANDLE (MISCELLANEOUS) ×1 IMPLANT
DERMABOND ADVANCED .7 DNX12 (GAUZE/BANDAGES/DRESSINGS) ×1 IMPLANT
DRAPE EENT ADH APERT 31X51 STR (DRAPES) IMPLANT
DRAPE HALF SHEET 40X57 (DRAPES) IMPLANT
DRAPE MICROSCOPE LEICA 54X105 (DRAPES) ×1 IMPLANT
DRSG EMULSION OIL 3X3 NADH (GAUZE/BANDAGES/DRESSINGS) IMPLANT
DRSG GLASSCOCK MASTOID ADT (GAUZE/BANDAGES/DRESSINGS) IMPLANT
DRSG GLASSCOCK MASTOID PED (GAUZE/BANDAGES/DRESSINGS) IMPLANT
DRSG TELFA 3X8 NADH STRL (GAUZE/BANDAGES/DRESSINGS) IMPLANT
ELECT COATED BLADE 2.86 ST (ELECTRODE) ×1 IMPLANT
ELECT REM PT RETURN 9FT ADLT (ELECTROSURGICAL) ×1 IMPLANT
ELECTRODE REM PT RTRN 9FT ADLT (ELECTROSURGICAL) ×1 IMPLANT
GAUZE PACKING IODOFORM 1/2INX (GAUZE/BANDAGES/DRESSINGS) IMPLANT
GLOVE ECLIPSE 7.5 STRL STRAW (GLOVE) ×1 IMPLANT
GOWN STRL REUS W/ TWL LRG LVL3 (GOWN DISPOSABLE) ×2 IMPLANT
GOWN STRL REUS W/ TWL XL LVL3 (GOWN DISPOSABLE) ×1 IMPLANT
IV CATH 18G X1.75 CATHLON (IV SOLUTION) ×1 IMPLANT
KIT BASIN OR (CUSTOM PROCEDURE TRAY) ×1 IMPLANT
KIT TURNOVER KIT B (KITS) ×1 IMPLANT
NDL PRECISIONGLIDE 27X1.5 (NEEDLE) ×1 IMPLANT
NEEDLE PRECISIONGLIDE 27X1.5 (NEEDLE) ×1 IMPLANT
NS IRRIG 1000ML POUR BTL (IV SOLUTION) ×1 IMPLANT
PAD ARMBOARD 7.5X6 YLW CONV (MISCELLANEOUS) ×2 IMPLANT
PENCIL FOOT CONTROL (ELECTRODE) ×1 IMPLANT
PUNCH BIOPSY 4MM (MISCELLANEOUS) ×1 IMPLANT
PUNCH BIOPSY DISP 4 (MISCELLANEOUS) ×1 IMPLANT
SPECIMEN JAR SMALL (MISCELLANEOUS) IMPLANT
SPIKE FLUID TRANSFER (MISCELLANEOUS) ×1 IMPLANT
SPONGE SURGIFOAM ABS GEL 12-7 (HEMOSTASIS) ×1 IMPLANT
STRIP CLOSURE SKIN 1/2X4 (GAUZE/BANDAGES/DRESSINGS) IMPLANT
SUT CHROMIC 3 0 PS 2 (SUTURE) IMPLANT
SUT CHROMIC 3 0 SH 27 (SUTURE) IMPLANT
SUT CHROMIC 4 0 RB 1X27 (SUTURE) IMPLANT
SUT VICRYL 4-0 PS2 18IN ABS (SUTURE) IMPLANT
SYR 20ML ECCENTRIC (SYRINGE) IMPLANT
TOWEL GREEN STERILE FF (TOWEL DISPOSABLE) ×1 IMPLANT
TRAY ENT MC OR (CUSTOM PROCEDURE TRAY) ×1 IMPLANT
TUBING EXTENTION W/L.L. (IV SETS) IMPLANT
WATER STERILE IRR 1000ML POUR (IV SOLUTION) ×1 IMPLANT
WIPE INSTRUMENT VISIWIPE 73X73 (MISCELLANEOUS) ×1 IMPLANT

## 2023-03-30 NOTE — Op Note (Signed)
OPERATIVE REPORT  DATE OF SURGERY: 03/30/2023  PATIENT:  Dustin Santiago,  32 y.o. male  PRE-OPERATIVE DIAGNOSIS:  Conductive hearing loss of right ear with unrestricted hearing of left ear, Perforation of right tympanic membrane  POST-OPERATIVE DIAGNOSIS:  Conductive hearing loss of right ear with unrestricted hearing of left ear,Perforation of right tympanic membrane  PROCEDURE:  Procedure(s): TYMPANOPLASTY, right  SURGEON:  Susy Frizzle, MD  ASSISTANTS: None  ANESTHESIA:   General   EBL: 40 ml  DRAINS: None  LOCAL MEDICATIONS USED: 1% Xylocaine with epinephrine  SPECIMEN:  none  COUNTS:  Correct  PROCEDURE DETAILS: The patient was taken to the operating room and placed on the operating table in the supine position. Following induction of general endotracheal anesthesia, the right ear was prepped and draped in the standard fashion.  The operating microscope was used throughout the case and was draped with a sterile microscope draped.  The ear canal was inspected.  The perforation was partially blocked by some dried skin.  Once this was cleared out the perforation was found to be anterior, marginal, and approximately 50% of the pars tensa.  The ear canal was infiltrated with local anesthetic in 4 quadrants.  The postauricular sulcus was also infiltrated.  A sickle knife was used to incise the ear canal in a radial fashion at 8:00 and 4:00.  A round knife was used to create a vascular strip.  The postauricular incision was created with electrocautery.  Graft was harvested from loose areolar tissue lateral to the temporalis fascia.  This was pressed and dried on the back table.  The linea temporalis and mastoid periosteum were incised down to the bone and the ear was brought forward and secured in place with a Perkins retractor.  The edges of the perforation were cleared off using a sharp pick and cup forceps.  A tap knife was used to elevate the annulus anteriorly and inferiorly.   Round knife was used to create a tympanomeatal flap was was brought forward towards the manubrium.  The chorda tympani nerve was never identified.  Middle ear was clean and dry.  Ossicular chain was intact and healthy.  There is no signs of infection or cholesteatoma.  There is no granulation tissue.  The middle ear was packed with saline soaked Gelfoam.  The graft was notched for the manubrium and was placed in an underlay technique.  Additional packing was placed in the in the middle ear.  The annular edges were positioned down along the graft and all directions.  The tympanomeatal flap was brought back to its native position.  The ear canal was packed with Ciprodex soaked Gelfoam.  Postauricular incision was reapproximated initially using interrupted 4-0 chromic on the periosteum and then a running subcuticular 4-0 chromic.  Dermabond was used on the skin.  Ear canal was then reinspected and the vascular strip was laid out along the ear canal bone secured in place with additional Ciprodex soaked Gelfoam.  Cottonball with bacitracin was placed at the external meatus and a Glasscock dressing was applied.  Patient was awakened extubated and transferred to recovery in stable condition.    PATIENT DISPOSITION:  To PACU, stable

## 2023-03-30 NOTE — Anesthesia Preprocedure Evaluation (Addendum)
Anesthesia Evaluation  Patient identified by MRN, date of birth, ID band Patient awake    Reviewed: Allergy & Precautions, H&P , NPO status , Patient's Chart, lab work & pertinent test results  Airway Mallampati: I  TM Distance: >3 FB     Dental no notable dental hx. (+) Teeth Intact, Dental Advisory Given   Pulmonary neg pulmonary ROS   Pulmonary exam normal breath sounds clear to auscultation       Cardiovascular hypertension, Pt. on medications  Rhythm:Regular Rate:Normal     Neuro/Psych negative neurological ROS  negative psych ROS   GI/Hepatic Neg liver ROS,GERD  ,,  Endo/Other  negative endocrine ROS    Renal/GU negative Renal ROS  negative genitourinary   Musculoskeletal   Abdominal   Peds  Hematology negative hematology ROS (+)   Anesthesia Other Findings   Reproductive/Obstetrics negative OB ROS                             Anesthesia Physical Anesthesia Plan  ASA: 2  Anesthesia Plan: General   Post-op Pain Management: Tylenol PO (pre-op)*   Induction: Intravenous  PONV Risk Score and Plan: 3 and Ondansetron, Dexamethasone and Midazolam  Airway Management Planned: Oral ETT  Additional Equipment:   Intra-op Plan:   Post-operative Plan: Extubation in OR  Informed Consent: I have reviewed the patients History and Physical, chart, labs and discussed the procedure including the risks, benefits and alternatives for the proposed anesthesia with the patient or authorized representative who has indicated his/her understanding and acceptance.     Dental advisory given  Plan Discussed with: CRNA  Anesthesia Plan Comments:        Anesthesia Quick Evaluation

## 2023-03-30 NOTE — Discharge Instructions (Signed)
For the next 3 weeks, avoid the following:  Nose blowing Sneezing with mouth closed Lifting anything greater than 10 pounds Straining. Keep all water out of the ear.  Thursday morning you may remove the dressing.  First undo the Velcro strap and remove the entire dressing.  The small adhesive pad can come off of the forehead.  The thin dressing behind the ear can come off.  Remove the cottonball from the ear, place the eardrops and then replace a fresh cottonball.  Repeat that twice daily until next appointment.

## 2023-03-30 NOTE — Anesthesia Procedure Notes (Signed)
Procedure Name: Intubation Date/Time: 03/30/2023 9:51 AM  Performed by: Hessie Diener, CRNAPre-anesthesia Checklist: Patient identified, Emergency Drugs available, Suction available and Patient being monitored Patient Re-evaluated:Patient Re-evaluated prior to induction Oxygen Delivery Method: Circle System Utilized Preoxygenation: Pre-oxygenation with 100% oxygen Induction Type: IV induction Ventilation: Mask ventilation without difficulty Laryngoscope Size: Mac and 3 Grade View: Grade I Tube type: Oral Tube size: 7.5 mm Number of attempts: 1 Airway Equipment and Method: Stylet and Oral airway Placement Confirmation: ETT inserted through vocal cords under direct vision, positive ETCO2 and breath sounds checked- equal and bilateral Tube secured with: Tape Dental Injury: Teeth and Oropharynx as per pre-operative assessment

## 2023-03-30 NOTE — Interval H&P Note (Signed)
History and Physical Interval Note:  03/30/2023 9:13 AM  Dustin Santiago  has presented today for surgery, with the diagnosis of Conductive hearing loss of right ear with unrestricted hearing of left ear, Perforation of right tympanic membrane.  The various methods of treatment have been discussed with the patient and family. After consideration of risks, benefits and other options for treatment, the patient has consented to  Procedure(s): TYMPANOPLASTY (Right) as a surgical intervention.  The patient's history has been reviewed, patient examined, no change in status, stable for surgery.  I have reviewed the patient's chart and labs.  Questions were answered to the patient's satisfaction.     Serena Colonel

## 2023-03-30 NOTE — Transfer of Care (Signed)
Immediate Anesthesia Transfer of Care Note  Patient: Dustin Santiago  Procedure(s) Performed: TYMPANOPLASTY (Right: Ear)  Patient Location: PACU  Anesthesia Type:General  Level of Consciousness: drowsy  Airway & Oxygen Therapy: Patient connected to face mask oxygen  Post-op Assessment: Post -op Vital signs reviewed and stable  Post vital signs: stable  Last Vitals:  Vitals Value Taken Time  BP 102/78 03/30/23 1121  Temp    Pulse 63 03/30/23 1122  Resp 13 03/30/23 1122  SpO2 100 % 03/30/23 1122  Vitals shown include unfiled device data.  Last Pain:  Vitals:   03/30/23 0751  TempSrc:   PainSc: 0-No pain         Complications: There were no known notable events for this encounter.

## 2023-03-30 NOTE — Anesthesia Postprocedure Evaluation (Signed)
Anesthesia Post Note  Patient: Dustin Santiago  Procedure(s) Performed: TYMPANOPLASTY (Right: Ear)     Patient location during evaluation: PACU Anesthesia Type: General Level of consciousness: awake and alert Pain management: pain level controlled Vital Signs Assessment: post-procedure vital signs reviewed and stable Respiratory status: spontaneous breathing, nonlabored ventilation and respiratory function stable Cardiovascular status: blood pressure returned to baseline and stable Postop Assessment: no apparent nausea or vomiting Anesthetic complications: no  There were no known notable events for this encounter.  Last Vitals:  Vitals:   03/30/23 1200 03/30/23 1215  BP: (!) 140/75 (!) 144/88  Pulse: 73 (!) 59  Resp: 14 13  Temp:  36.5 C  SpO2: 100% 98%    Last Pain:  Vitals:   03/30/23 1215  TempSrc:   PainSc: 0-No pain                 Devian Bartolomei,W. EDMOND

## 2023-03-31 ENCOUNTER — Encounter (HOSPITAL_COMMUNITY): Payer: Self-pay | Admitting: Otolaryngology
